# Patient Record
Sex: Male | Born: 2000 | Race: Black or African American | Hispanic: No | Marital: Single | Smoking: Never smoker
Health system: Southern US, Community
[De-identification: ages and names within clinical notes are randomized; demographics above are authoritative.]

## PROBLEM LIST (undated history)

## (undated) DIAGNOSIS — J45909 Unspecified asthma, uncomplicated: Secondary | ICD-10-CM

---
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
## 2022-04-14 ENCOUNTER — Ambulatory Visit
Admission: RE | Admit: 2022-04-14 | Discharge: 2022-04-14 | Disposition: A | Discharge status: Home / Self Care | Payer: BLUE CROSS/BLUE SHIELD | Attending: Physician Assistant | Admitting: Physician Assistant

## 2022-04-14 ENCOUNTER — Ambulatory Visit
Admission: RE | Admit: 2022-04-14 | Discharge: 2022-04-14 | Disposition: A | Discharge status: Home / Self Care | Payer: BLUE CROSS/BLUE SHIELD | Source: Ambulatory Visit | Attending: Physician Assistant | Admitting: Physician Assistant

## 2022-04-14 ENCOUNTER — Other Ambulatory Visit: Payer: Self-pay | Admitting: Physician Assistant

## 2022-04-14 DIAGNOSIS — R1013 Epigastric pain: Secondary | ICD-10-CM | POA: Diagnosis present

## 2023-10-17 IMAGING — CR DG ABDOMEN 1V
2 series · 2 of 2 positions shown · non-contrast
Comparison: 02/09/2006

CLINICAL DATA: Right lower quadrant abdominal pain

EXAM:
ABDOMEN - 1 VIEW

[abdomen kub (1 of 2)]
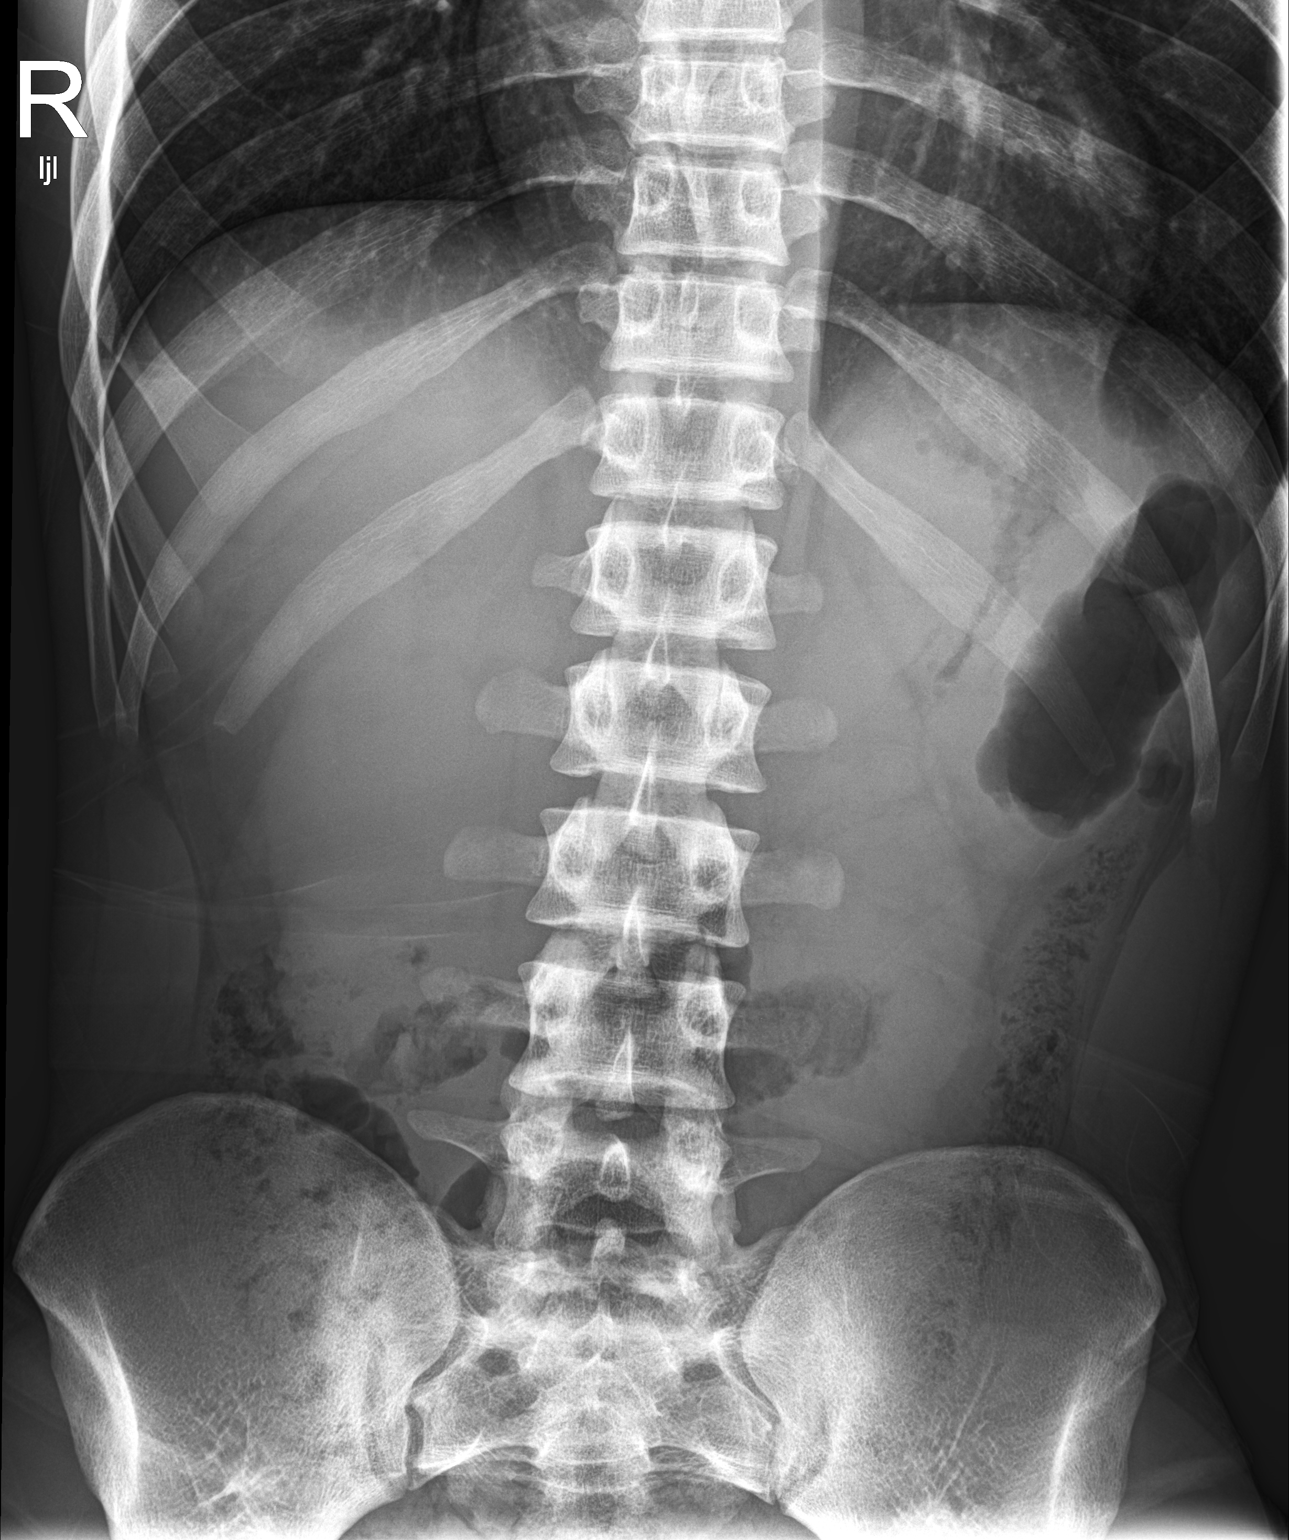

[abdomen kub (2 of 2)]
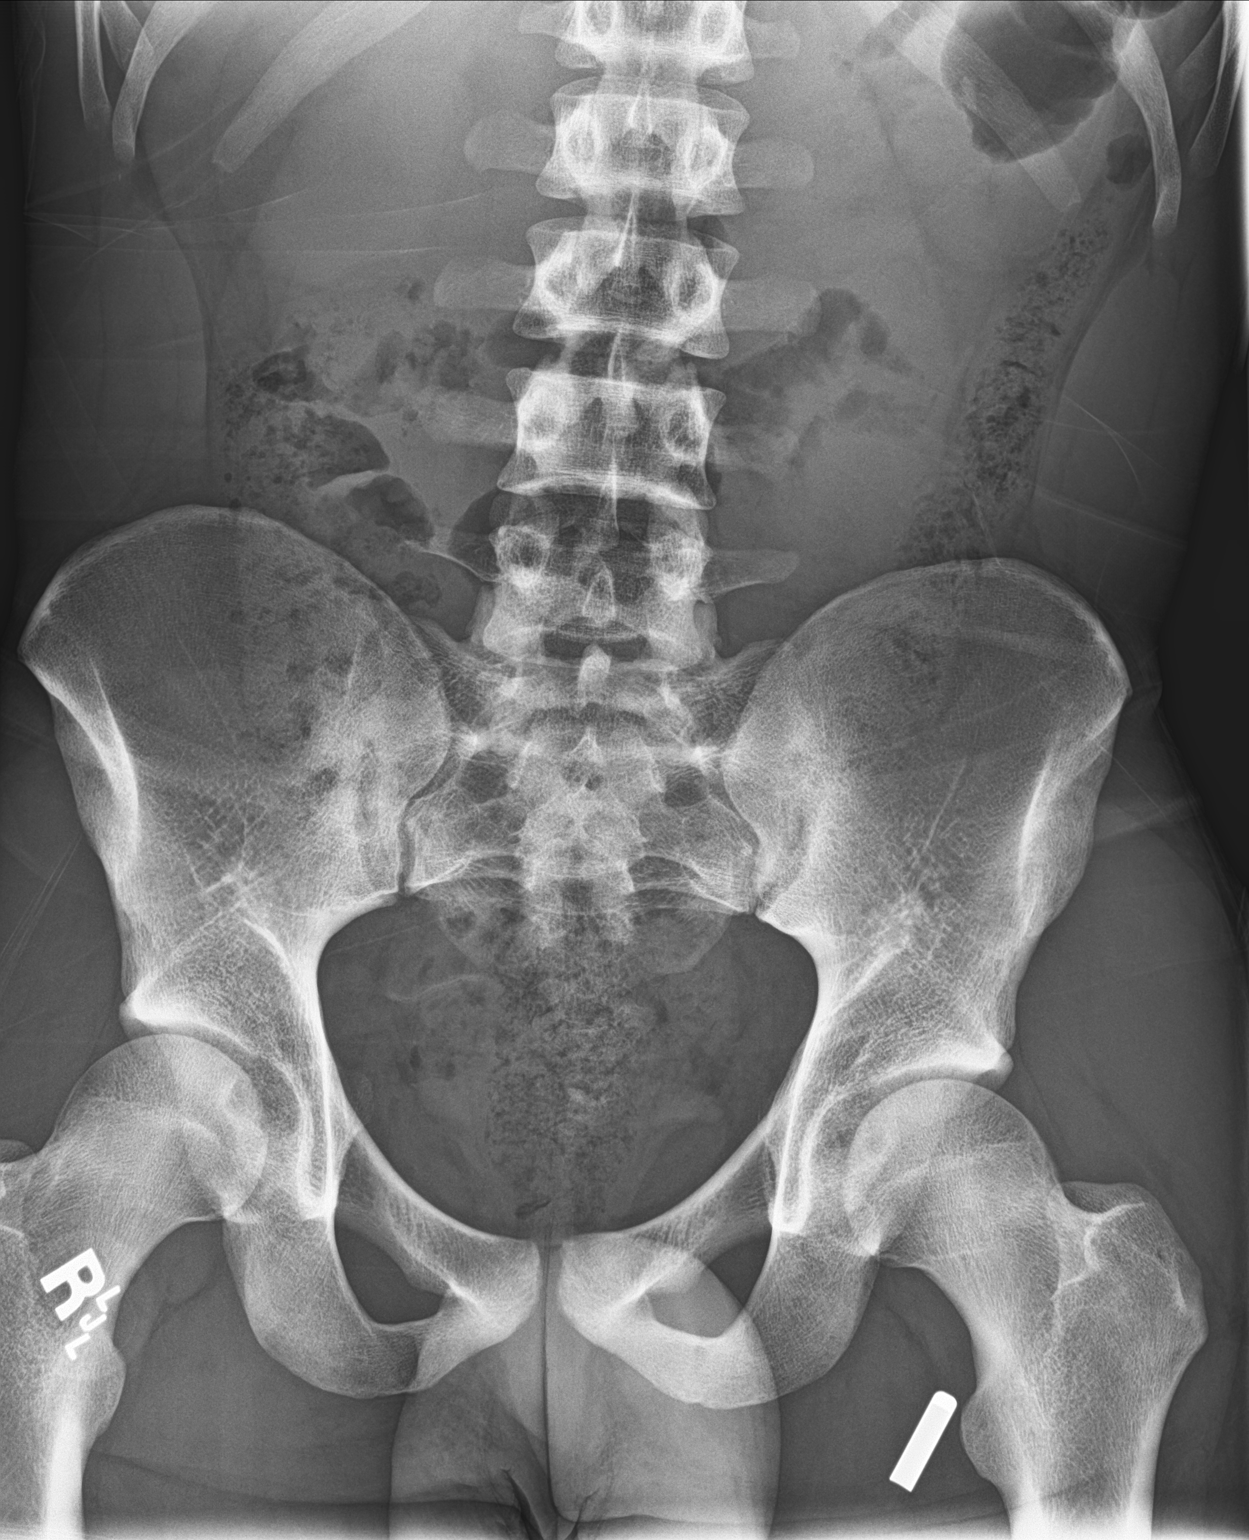

[2 of 2 positions shown; findings below may reference images not displayed]

FINDINGS: The bowel gas pattern is normal. No radio-opaque calculi or other
significant radiographic abnormality are seen.
IMPRESSION: Nonobstructive pattern of bowel gas. No radiographic findings to
explain pain.
